# Patient Record
Sex: Male | Born: 1960 | Race: White | Hispanic: No | Marital: Single | Smoking: Never smoker
Health system: Southern US, Community
[De-identification: ages and names within clinical notes are randomized; demographics above are authoritative.]

---

## 2007-07-17 ENCOUNTER — Ambulatory Visit: Payer: Self-pay | Admitting: Internal Medicine

## 2007-07-17 LAB — CONVERTED CEMR LAB
Albumin: 4.1 g/dL (ref 3.5–5.2)
Basophils Absolute: 0 10*3/uL (ref 0.0–0.1)
Bilirubin, Direct: 0.1 mg/dL (ref 0.0–0.3)
Cholesterol: 101 mg/dL (ref 0–200)
Eosinophils Absolute: 0.3 10*3/uL (ref 0.0–0.6)
GFR calc Af Amer: 94 mL/min
GFR calc non Af Amer: 78 mL/min
Glucose, Bld: 125 mg/dL — ABNORMAL HIGH (ref 70–99)
HCT: 45.6 % (ref 39.0–52.0)
Hemoglobin, Urine: NEGATIVE
Hemoglobin: 15.4 g/dL (ref 13.0–17.0)
Hgb A1c MFr Bld: 5.9 % (ref 4.6–6.0)
Ketones, ur: NEGATIVE mg/dL
Lymphocytes Relative: 38 % (ref 12.0–46.0)
MCHC: 33.9 g/dL (ref 30.0–36.0)
MCV: 84.4 fL (ref 78.0–100.0)
Monocytes Absolute: 0.8 10*3/uL — ABNORMAL HIGH (ref 0.2–0.7)
Neutro Abs: 3.2 10*3/uL (ref 1.4–7.7)
Neutrophils Relative %: 44.8 % (ref 43.0–77.0)
PSA: 0.44 ng/mL (ref 0.10–4.00)
Potassium: 3.9 meq/L (ref 3.5–5.1)
RBC: 5.4 M/uL (ref 4.22–5.81)
Sodium: 143 meq/L (ref 135–145)
TSH: 1.61 microintl units/mL (ref 0.35–5.50)
Total CHOL/HDL Ratio: 5.1
Urobilinogen, UA: 0.2 (ref 0.0–1.0)

## 2007-07-21 ENCOUNTER — Ambulatory Visit: Payer: Self-pay | Admitting: Internal Medicine

## 2007-09-27 ENCOUNTER — Encounter: Payer: Self-pay | Admitting: Internal Medicine

## 2016-06-29 ENCOUNTER — Ambulatory Visit
Admission: RE | Admit: 2016-06-29 | Discharge: 2016-06-29 | Disposition: A | Discharge status: Home / Self Care | Payer: BLUE CROSS/BLUE SHIELD | Source: Ambulatory Visit | Attending: Gastroenterology | Admitting: Gastroenterology

## 2016-06-29 ENCOUNTER — Other Ambulatory Visit: Payer: Self-pay | Admitting: Gastroenterology

## 2016-06-29 DIAGNOSIS — R079 Chest pain, unspecified: Secondary | ICD-10-CM

## 2016-07-12 ENCOUNTER — Other Ambulatory Visit: Payer: Self-pay | Admitting: Gastroenterology

## 2016-07-12 DIAGNOSIS — R1011 Right upper quadrant pain: Secondary | ICD-10-CM

## 2016-07-16 ENCOUNTER — Ambulatory Visit
Admission: RE | Admit: 2016-07-16 | Discharge: 2016-07-16 | Disposition: A | Discharge status: Home / Self Care | Payer: BLUE CROSS/BLUE SHIELD | Source: Ambulatory Visit | Attending: Gastroenterology | Admitting: Gastroenterology

## 2016-07-16 DIAGNOSIS — R1011 Right upper quadrant pain: Secondary | ICD-10-CM

## 2016-08-03 ENCOUNTER — Institutional Professional Consult (permissible substitution): Payer: BLUE CROSS/BLUE SHIELD | Admitting: Pulmonary Disease

## 2017-06-22 IMAGING — CR DG CHEST 2V
2 series · 2 of 2 positions shown · non-contrast
Comparison: None.

CLINICAL DATA: Mid anterior chest pain for 1 month.

EXAM:
CHEST  2 VIEW

[w chest pa]
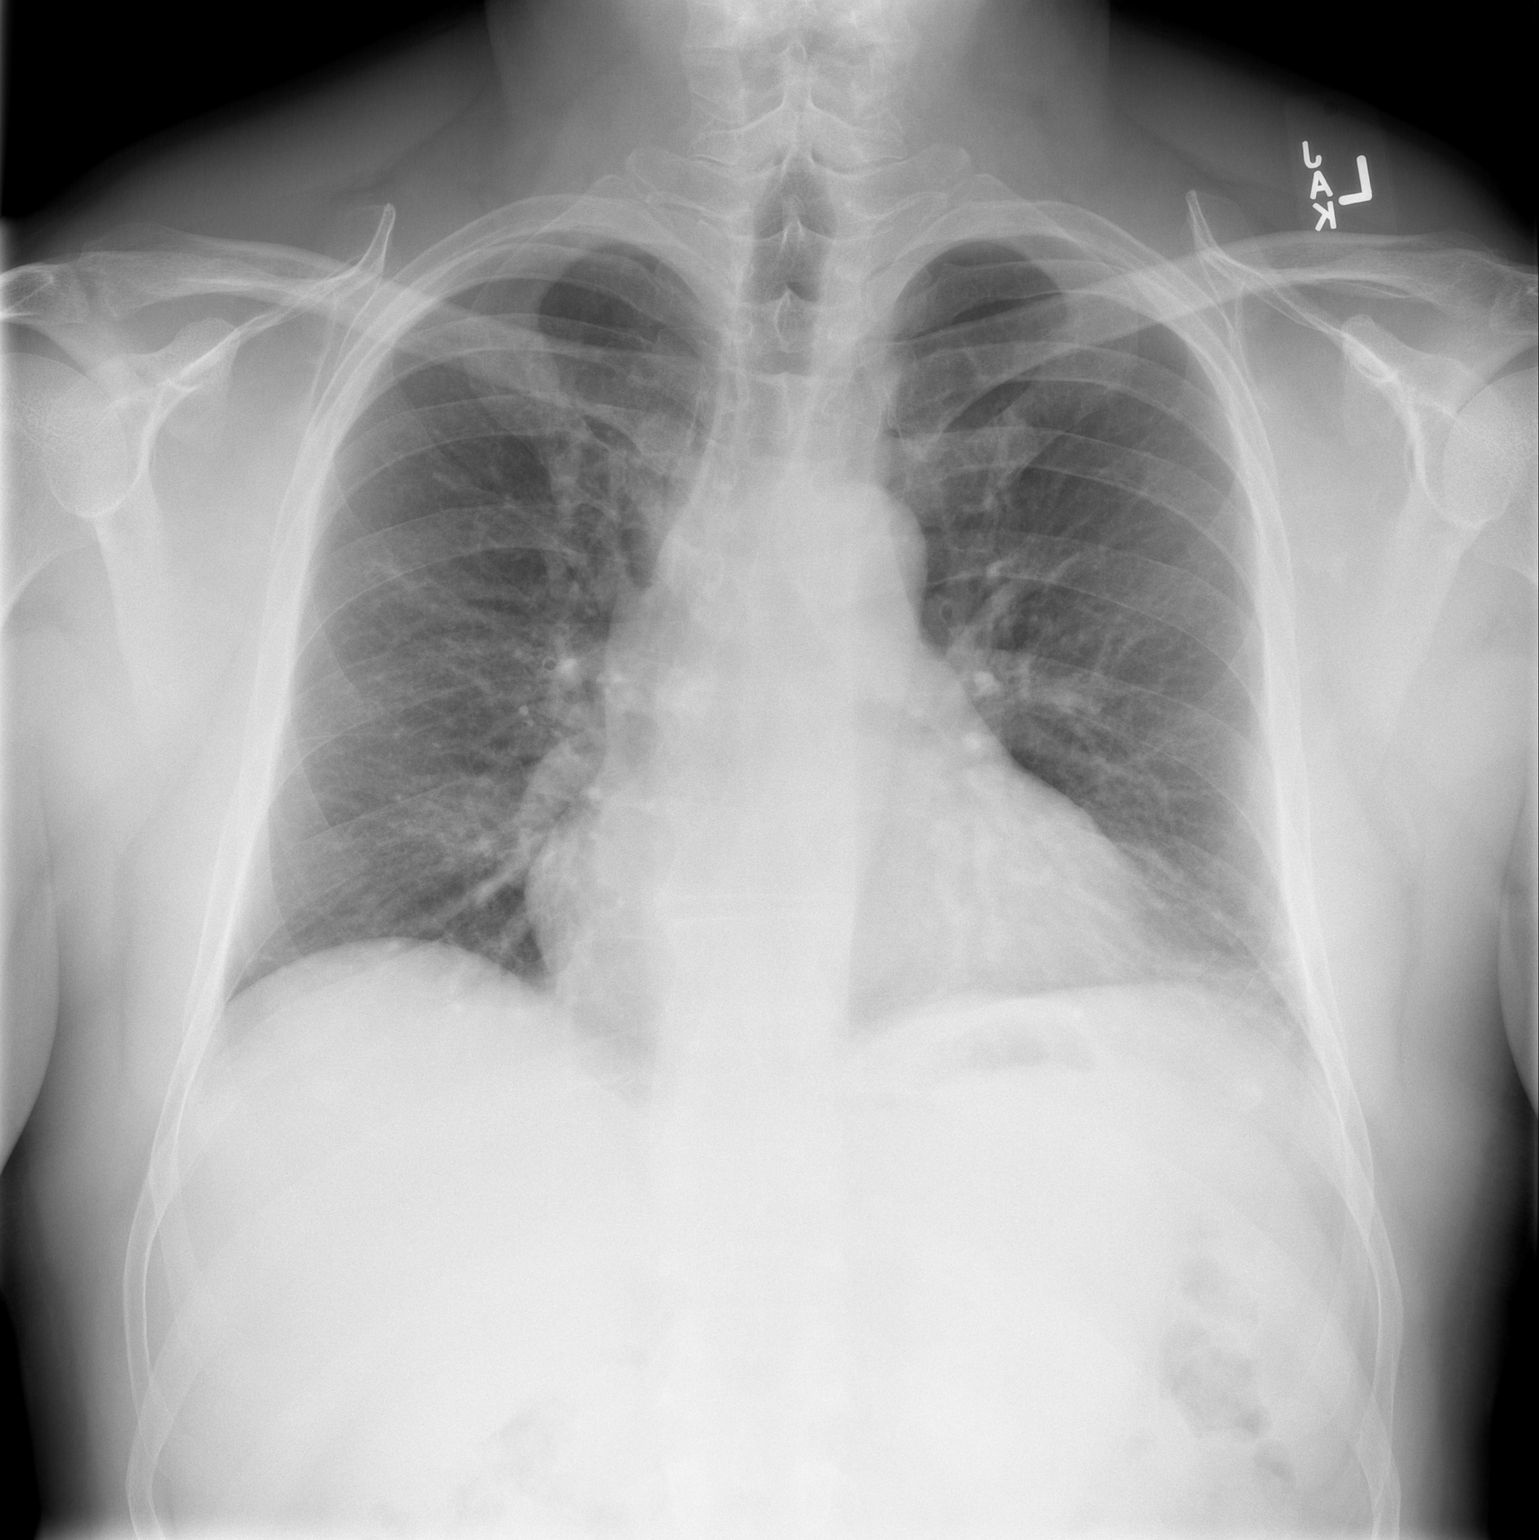

[w chest lat]
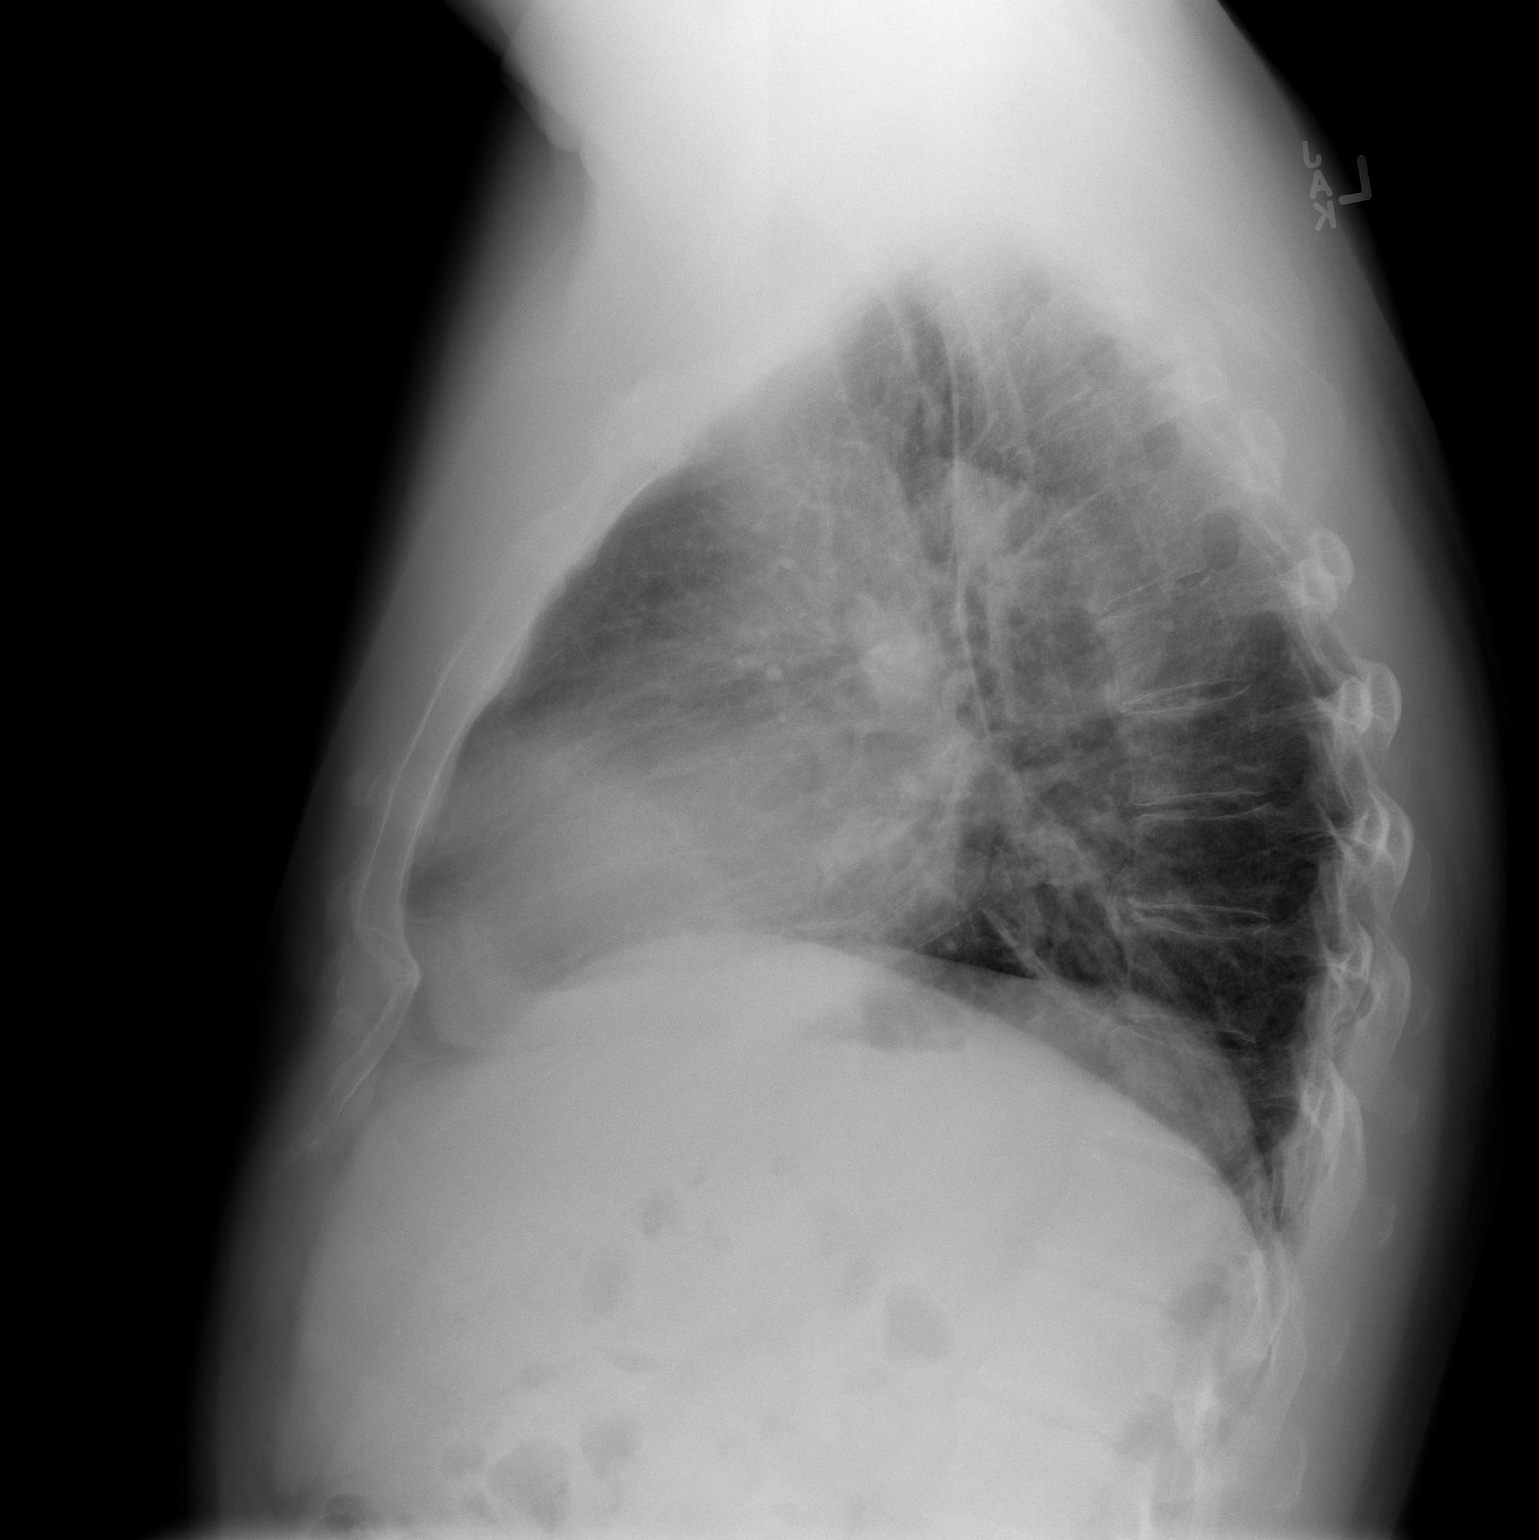

[2 of 2 positions shown; findings below may reference images not displayed]

FINDINGS: The mediastinal contour is normal. Heart size is mildly enlarged.
There is no focal infiltrate, pulmonary edema, or pleural effusion.
There is mild patchy atelectasis or scar of the lateral left lung
base. The visualized skeletal structures are unremarkable.
IMPRESSION: No active cardiopulmonary disease. Mild atelectasis versus scar of
lateral left lung base.

## 2017-07-09 IMAGING — US US ABDOMEN LIMITED
1 series · 14 of 25 positions shown · non-contrast
Comparison: None.

CLINICAL DATA: Right upper quadrant abdominal pain for 2 months.

EXAM:
US ABDOMEN LIMITED - RIGHT UPPER QUADRANT

[Series 1: us abdomen limited · 0.27mm/px · 14 of 33 slices shown]
[im 1/33]
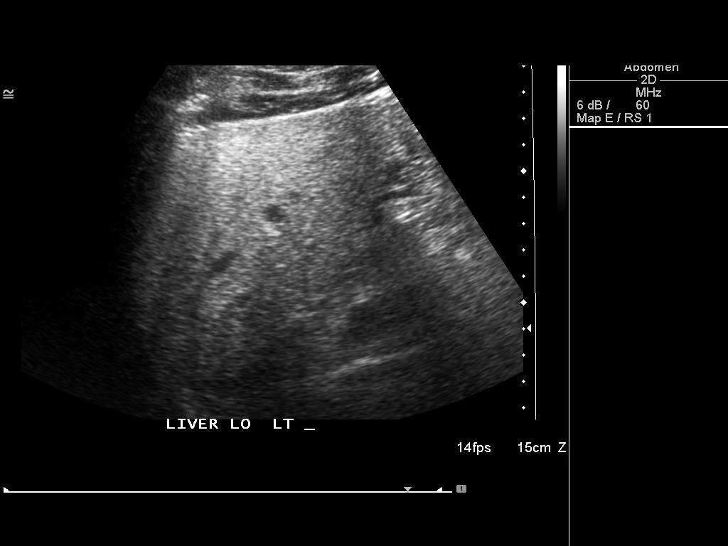
[im 3/33]
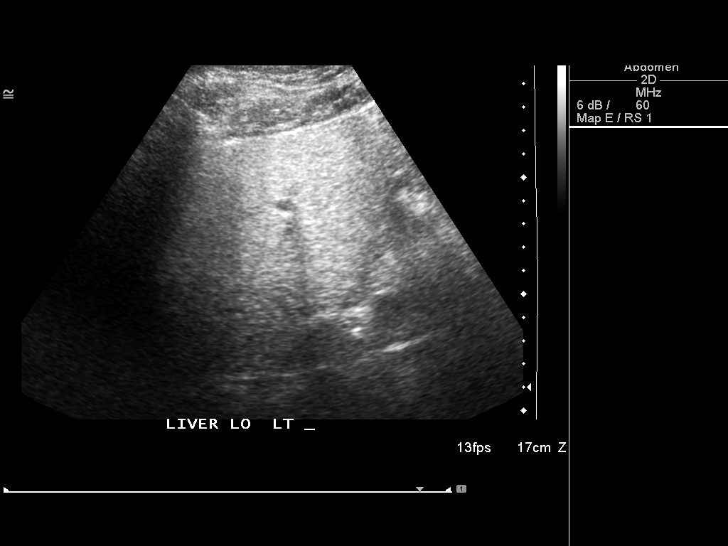
[im 6/33]
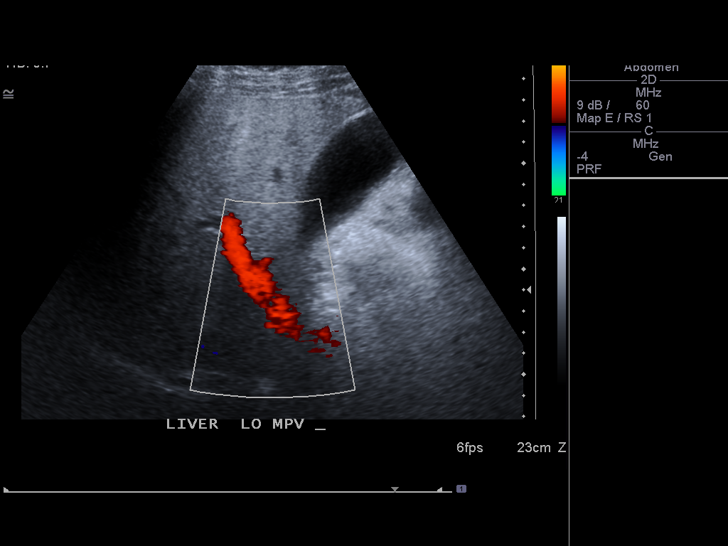
[im 9/33]
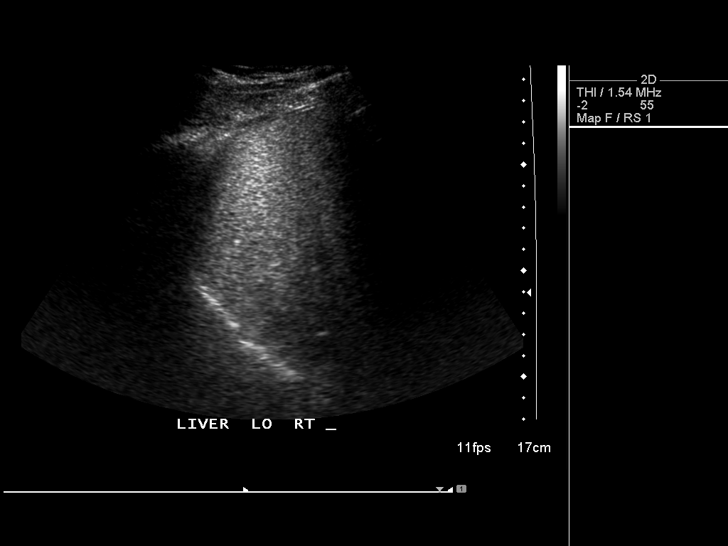
[im 11/33]
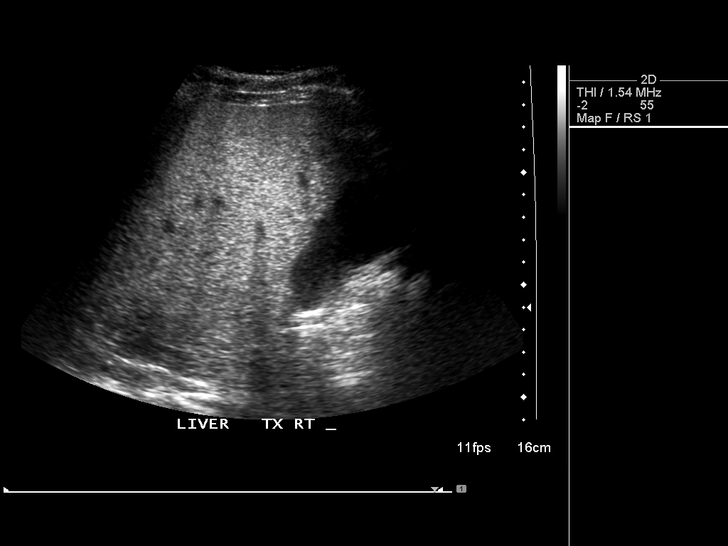
[im 13/33]
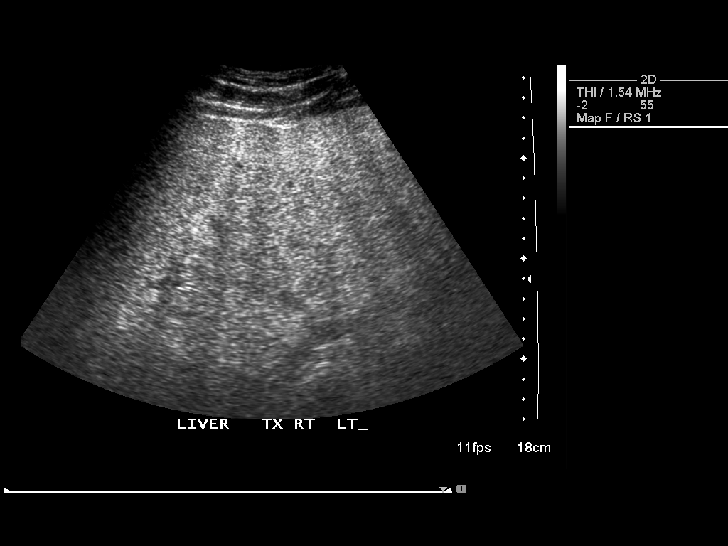
[im 15/33]
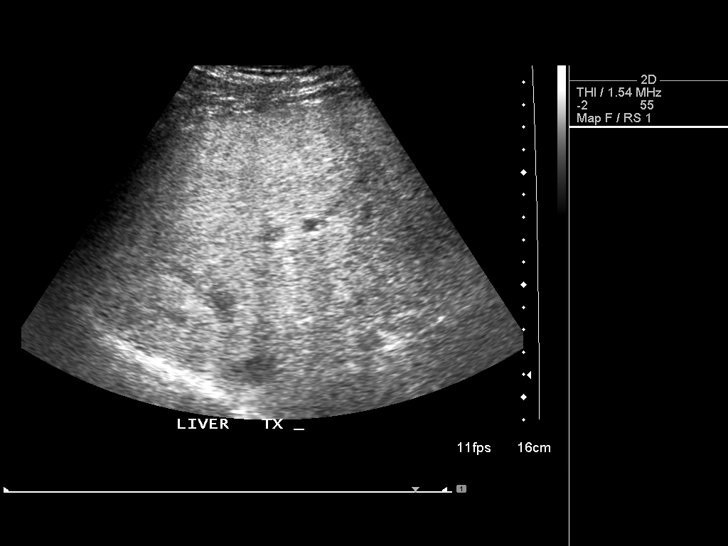
[im 18/33]
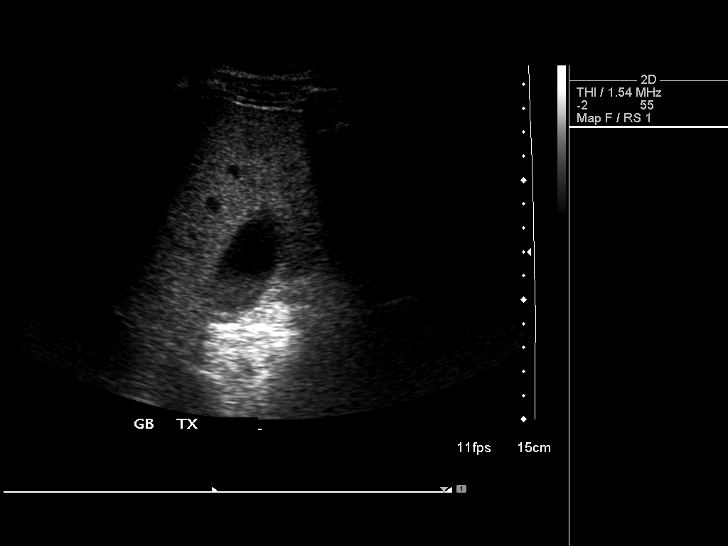
[im 21/33]
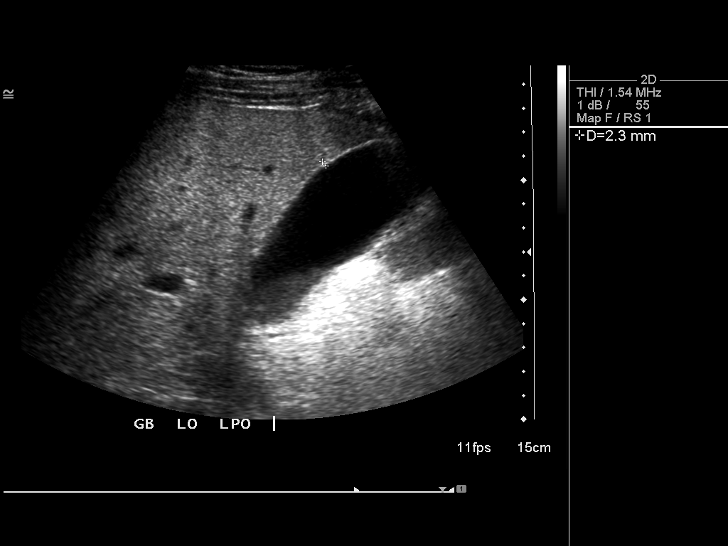
[im 22/33]
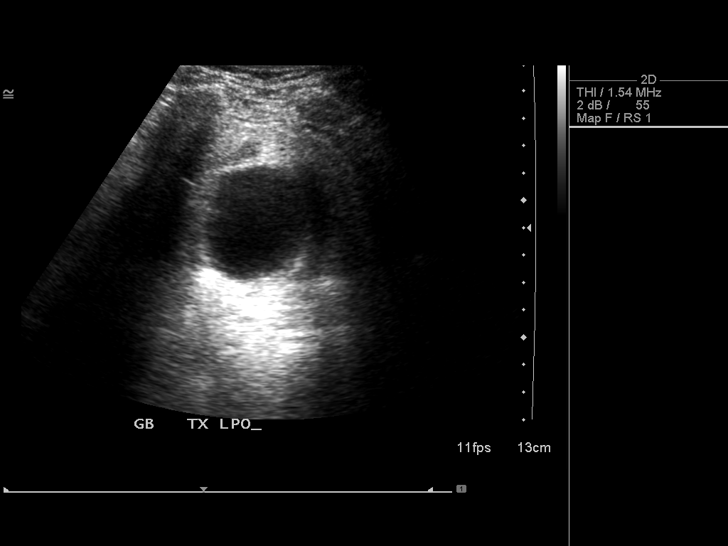
[im 25/33]
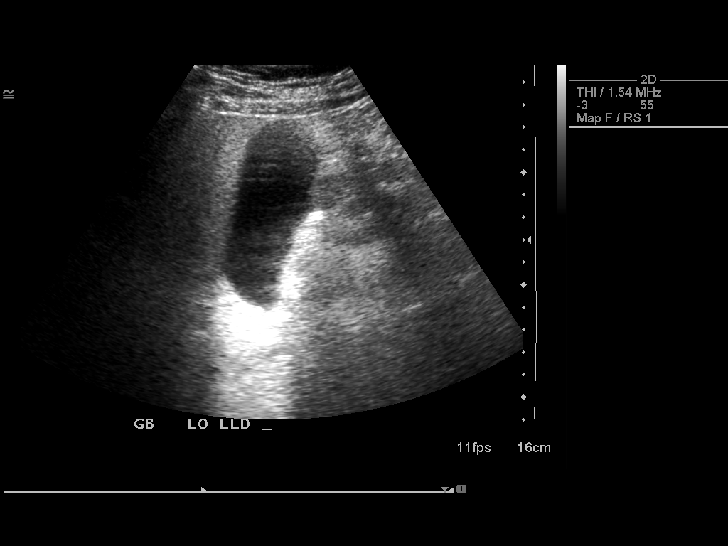
[im 27/33]
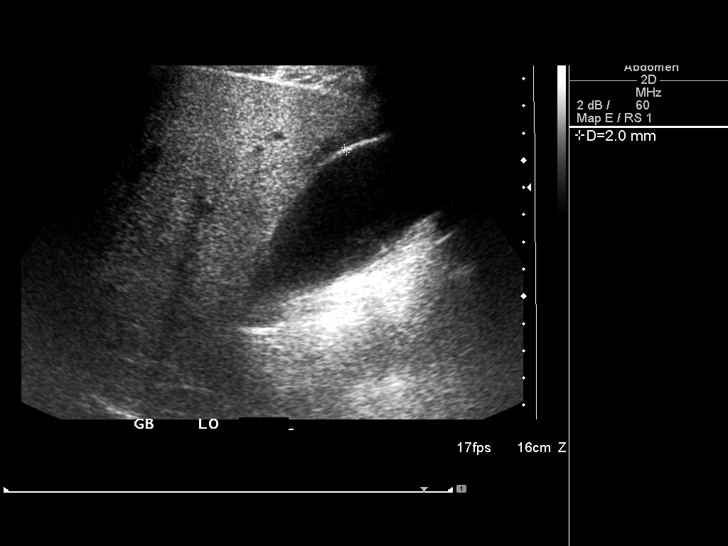
[im 30/33]
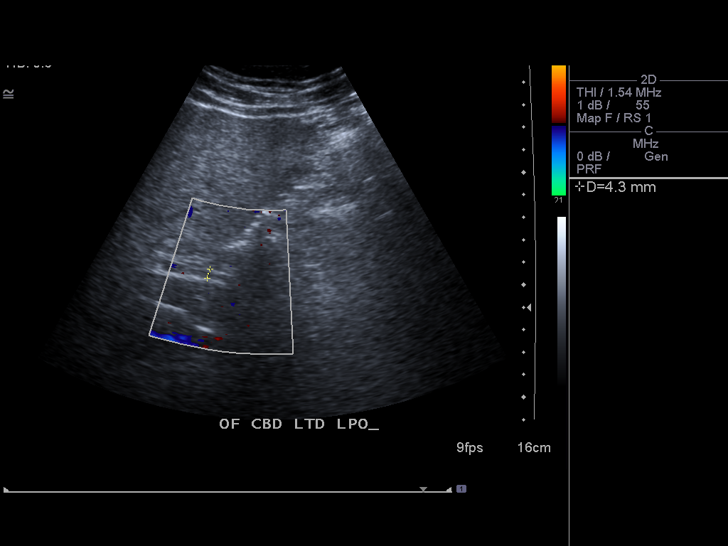
[im 33/33]
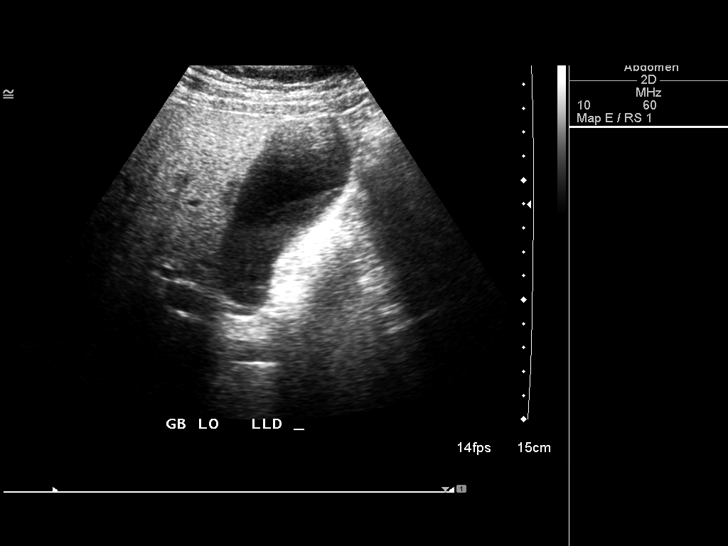

[14 of 25 positions shown; findings below may reference images not displayed]

FINDINGS: Gallbladder:

No gallstones or wall thickening visualized. No sonographic Murphy
sign noted by sonographer. Sludge is noted in the gallbladder lumen.

Common bile duct:

Diameter: 4.3 mm which is within normal limits.

Liver:

No focal lesion identified. Increased echogenicity is noted
consistent with fatty infiltration cannot.
IMPRESSION: Fatty infiltration of the liver. Sludge within gallbladder lumen. No
other abnormality seen in the right upper quadrant of the abdomen.

## 2021-08-11 ENCOUNTER — Other Ambulatory Visit: Payer: Self-pay

## 2021-08-11 ENCOUNTER — Emergency Department (HOSPITAL_BASED_OUTPATIENT_CLINIC_OR_DEPARTMENT_OTHER)
Admission: EM | Admit: 2021-08-11 | Discharge: 2021-08-11 | Disposition: A | Discharge status: Home / Self Care | Payer: BC Managed Care – PPO | Attending: Emergency Medicine | Admitting: Emergency Medicine

## 2021-08-11 ENCOUNTER — Encounter (HOSPITAL_BASED_OUTPATIENT_CLINIC_OR_DEPARTMENT_OTHER): Payer: Self-pay | Admitting: Emergency Medicine

## 2021-08-11 DIAGNOSIS — W228XXA Striking against or struck by other objects, initial encounter: Secondary | ICD-10-CM | POA: Insufficient documentation

## 2021-08-11 DIAGNOSIS — Y9389 Activity, other specified: Secondary | ICD-10-CM | POA: Diagnosis not present

## 2021-08-11 DIAGNOSIS — M779 Enthesopathy, unspecified: Secondary | ICD-10-CM | POA: Diagnosis not present

## 2021-08-11 DIAGNOSIS — M6790 Unspecified disorder of synovium and tendon, unspecified site: Secondary | ICD-10-CM

## 2021-08-11 DIAGNOSIS — M25821 Other specified joint disorders, right elbow: Secondary | ICD-10-CM | POA: Diagnosis not present

## 2021-08-11 DIAGNOSIS — M25521 Pain in right elbow: Secondary | ICD-10-CM | POA: Diagnosis present

## 2021-08-11 MED ORDER — IBUPROFEN 600 MG PO TABS: 600.0000 mg | ORAL_TABLET | Freq: Four times a day (QID) | ORAL | 0 refills | Status: AC

## 2021-08-11 MED ORDER — MELOXICAM 15 MG PO TABS: 15.0000 mg | ORAL_TABLET | Freq: Every day | ORAL | 0 refills | Status: AC

## 2021-08-11 MED ORDER — CYCLOBENZAPRINE HCL 10 MG PO TABS: 10.0000 mg | ORAL_TABLET | Freq: Two times a day (BID) | ORAL | 0 refills | Status: AC | PRN

## 2021-08-11 MED ORDER — IBUPROFEN 600 MG PO TABS: 600.0000 mg | ORAL_TABLET | Freq: Four times a day (QID) | ORAL | 0 refills | Status: DC | PRN

## 2021-08-11 MED ORDER — IBUPROFEN 400 MG PO TABS: 600.0000 mg | ORAL_TABLET | Freq: Once | ORAL | Status: DC

## 2021-08-11 MED ORDER — MELOXICAM 15 MG PO TABS: 15.0000 mg | ORAL_TABLET | Freq: Every day | ORAL | 0 refills | Status: DC

## 2021-08-11 NOTE — Discharge Instructions (Addendum)
Take the medication prescribed for the next 5 days with food.  Call the orthopedist to ensure optimal healing and future prevention of the condition.  We recommend no heavy lifting more than 10 pounds, any activity that requires repeated bending and extending the elbow for the 1 week or until cleared by orthopedic doctor.  Mobic is for wait and watch approach.  Take it only if the anti-inflammatory medication prescribed has not helped and do not combine Mobic with any other anti-inflammatory medications.

## 2021-08-11 NOTE — ED Provider Notes (Signed)
[facility] EMERGENCY DEPT Provider Note   CSN: [id number] Arrival date & time: 08/11/21  6834     History Chief Complaint  Patient presents with   Arm Injury    Edwin Thomas is a 60 y.o. male.  HPI    60 year old male comes in with chief complaint of arm pain and numbness.  Patient reports that he was golfing, he must of hit a root while swinging and is having pain over his right forearm.  Pain with flexion over the elbow and also he is experiencing some difficulty with gripping with his right hand and tingling sensation in his distal pads for all digits.  No history of similar injury or symptoms in the past.  He has some discomfort with palpation over the antecubital fossa.   History reviewed. No pertinent past medical history.  There are no problems to display for this patient.   History reviewed. No pertinent surgical history.     No family history on file.  Social History   Tobacco Use   Smoking status: Never   Smokeless tobacco: Never  Substance Use Topics   Alcohol use: Yes    Home Medications Prior to Admission medications   Medication Sig Start Date End Date Taking? Authorizing Provider  cyclobenzaprine (FLEXERIL) 10 MG tablet Take 1 tablet (10 mg total) by mouth 2 (two) times daily as needed for muscle spasms. 08/11/21  Yes Derwood Kaplan, MD  ibuprofen (ADVIL) 600 MG tablet Take 1 tablet (600 mg total) by mouth 4 (four) times daily. 08/11/21   Derwood Kaplan, MD  meloxicam (MOBIC) 15 MG tablet Take 1 tablet (15 mg total) by mouth daily. Cannot be combined with other antiinflammatory. Take with food or antacids 08/11/21   Derwood Kaplan, MD    Allergies    Patient has no known allergies.  Review of Systems   Review of Systems  Constitutional:  Positive for activity change.  Musculoskeletal:  Positive for arthralgias.  Skin:  Negative for wound.  Neurological:  Negative for numbness.  Hematological:  Does not bruise/bleed easily.    Physical Exam Updated Vital Signs BP (!) 144/83   Pulse 61   Temp 98.1 F (36.7 C) (Oral)   Resp 16   Ht 6\' 3"  (1.905 m)   Wt 102.1 kg   SpO2 97%   BMI 28.12 kg/m   Physical Exam Vitals and nursing note reviewed.  Constitutional:      Appearance: He is well-developed.  HENT:     Head: Atraumatic.  Cardiovascular:     Rate and Rhythm: Normal rate.  Pulmonary:     Effort: Pulmonary effort is normal.  Musculoskeletal:        General: Tenderness present. No swelling or deformity.     Cervical back: Neck supple.     Comments: Tenderness with palpation of the antecubital fossa of the right extremity. Positive tenderness with the pronation, but no tenderness with the wrist extension or flexion with extended right upper extremity.  Gross sensory exam is reassuring, patient able to discriminate between sharp and dull.  No objective numbness  Skin:    General: Skin is warm.  Neurological:     Mental Status: He is alert and oriented to person, place, and time.    ED Results / Procedures / Treatments   Labs (all labs ordered are listed, but only abnormal results are displayed) Labs Reviewed - No data to display  EKG None  Radiology No results found.  Procedures Procedures  Medications Ordered in ED Medications  ibuprofen (ADVIL) tablet 600 mg (has no administration in time range)    ED Course  I have reviewed the triage vital signs and the nursing notes.  Pertinent labs & imaging results that were available during my care of the patient were reviewed by me and considered in my medical decision making (see chart for details).    MDM Rules/Calculators/A&P                           59 year old male comes in a chief complaint of elbow pain and numbness and tingling over his fingertips.  Symptoms started when he was golfing and he thinks he might of hit a root while swinging.  Pain exaggerated with the bicipital flexion and pronation.  No tenderness over the  medial epicondyle.  Does not appear to be clear evidence of golfers elbow.  I suspect he might have tendinitis of the brachialis muscle or bicep muscle.  There could be some swelling around the neurovascular sheath over the elbow region.  Treatment is in to be conservative with the RICE and orthopedic follow-up for further diagnostic work-up if symptoms do not improve and for prevention of future injuries.  Final Clinical Impression(s) / ED Diagnoses Final diagnoses:  Olecranon impingement syndrome of right elbow  Tendinopathy    Rx / DC Orders ED Discharge Orders          Ordered    ibuprofen (ADVIL) 600 MG tablet  Every 6 hours PRN,   Status:  Discontinued        08/11/21 0832    ibuprofen (ADVIL) 600 MG tablet  4 times daily        08/11/21 0832    cyclobenzaprine (FLEXERIL) 10 MG tablet  2 times daily PRN        08/11/21 0833    meloxicam (MOBIC) 15 MG tablet  Daily,   Status:  Discontinued        08/11/21 0837    meloxicam (MOBIC) 15 MG tablet  Daily        08/11/21 0842             Derwood Kaplan, MD 08/11/21 [phone]

## 2021-08-11 NOTE — ED Triage Notes (Signed)
He was playing golf yesterday when he swung and hit a root. He states he immediately had numbness to the R arm and tingling in the fingers. States the forearm is still sore.

## 2024-11-16 ENCOUNTER — Encounter (HOSPITAL_BASED_OUTPATIENT_CLINIC_OR_DEPARTMENT_OTHER): Payer: Self-pay

## 2024-11-16 ENCOUNTER — Other Ambulatory Visit: Payer: Self-pay

## 2024-11-16 ENCOUNTER — Emergency Department (HOSPITAL_BASED_OUTPATIENT_CLINIC_OR_DEPARTMENT_OTHER): Admitting: Radiology

## 2024-11-16 ENCOUNTER — Other Ambulatory Visit (HOSPITAL_BASED_OUTPATIENT_CLINIC_OR_DEPARTMENT_OTHER): Payer: Self-pay

## 2024-11-16 ENCOUNTER — Emergency Department (HOSPITAL_BASED_OUTPATIENT_CLINIC_OR_DEPARTMENT_OTHER)
Admission: EM | Admit: 2024-11-16 | Discharge: 2024-11-16 | Disposition: A | Discharge status: Home / Self Care | Attending: Emergency Medicine | Admitting: Emergency Medicine

## 2024-11-16 DIAGNOSIS — R051 Acute cough: Secondary | ICD-10-CM | POA: Diagnosis present

## 2024-11-16 DIAGNOSIS — U071 COVID-19: Secondary | ICD-10-CM | POA: Insufficient documentation

## 2024-11-16 DIAGNOSIS — I10 Essential (primary) hypertension: Secondary | ICD-10-CM | POA: Insufficient documentation

## 2024-11-16 LAB — RESP PANEL BY RT-PCR (RSV, FLU A&B, COVID)  RVPGX2
Influenza A by PCR: NEGATIVE
Influenza B by PCR: NEGATIVE
Resp Syncytial Virus by PCR: NEGATIVE
SARS Coronavirus 2 by RT PCR: POSITIVE — AB

## 2024-11-16 MED ORDER — PAXLOVID (300/100) 20 X 150 MG & 10 X 100MG PO TBPK: 3.0000 | ORAL_TABLET | Freq: Two times a day (BID) | ORAL | 0 refills | Status: AC

## 2024-11-16 MED ORDER — BENZONATATE 100 MG PO CAPS: 100.0000 mg | ORAL_CAPSULE | Freq: Three times a day (TID) | ORAL | 0 refills | Status: AC | PRN

## 2024-11-16 NOTE — ED Triage Notes (Signed)
 Pt reports productive cough x3 days and a fever x2 days ago.

## 2024-11-16 NOTE — ED Provider Notes (Signed)
 " [facility] EMERGENCY DEPARTMENT AT [facility] Provider Note   CSN: [id number] Arrival date & time: 11/16/24  9166     Patient presents with: Cough   Edwin Thomas is a 63 y.o. male who presents to the emergency department with a chief complaint of cough as well as fever.  Patient states that he has had a cough for the last 3 days.  States that 2 days ago he also had a fever for approximately a day and a half.  He states however that the fever has now resolved and the cough is lingering.  Unsure of sick contacts.  Denies congestion, ear pain, or throat pain.  Denies chest pain or shortness of breath.  Past medical history significant for hypertension, hyperlipidemia, seasonal allergies, as well as sleep disturbances.  Patient recently had a physical on 11/05/2024 with his primary care provider and had screening labs done at this time.    Cough      Prior to Admission medications  Medication Sig Start Date End Date Taking? Authorizing Provider  amLODipine (NORVASC) 10 MG tablet Take 10 mg by mouth daily. 06/04/22  Yes [provider]  amLODipine-olmesartan (AZOR) 5-20 MG tablet Take 1 tablet by mouth daily. 11/06/24  Yes [provider]  amoxicillin-clavulanate (AUGMENTIN) 875-125 MG tablet Take 1 tablet by mouth every 12 (twelve) hours. 10/30/24  Yes [provider]  benzonatate  (TESSALON ) 100 MG capsule Take 1 capsule (100 mg total) by mouth 3 (three) times daily as needed for cough. 11/16/24  Yes Tristyn Pharris F, PA-C  cetirizine (ZYRTEC) 10 MG tablet Take 10 mg by mouth daily. 03/06/24  Yes [provider]  nirmatrelvir/ritonavir (PAXLOVID , 300/100,) 20 x 150 MG & 10 x 100MG  TBPK Take 3 tablets by mouth 2 (two) times daily for 5 days. Patient GFR is 76. Take nirmatrelvir (150 mg) two tablets twice daily for 5 days and ritonavir (100 mg) one tablet twice daily for 5 days. 11/16/24 11/21/24 Yes Ziggy Reveles F, PA-C  cyclobenzaprine   (FLEXERIL ) 10 MG tablet Take 1 tablet (10 mg total) by mouth 2 (two) times daily as needed for muscle spasms. 08/11/21   Charlyn Sora, MD  ibuprofen  (ADVIL ) 600 MG tablet Take 1 tablet (600 mg total) by mouth 4 (four) times daily. 08/11/21   Charlyn Sora, MD  meloxicam  (MOBIC ) 15 MG tablet Take 1 tablet (15 mg total) by mouth daily. Cannot be combined with other antiinflammatory. Take with food or antacids 08/11/21   Charlyn Sora, MD  simvastatin (ZOCOR) 10 MG tablet Take 10 mg by mouth at bedtime.    [provider]  zolpidem (AMBIEN) 10 MG tablet Take 5-10 mg by mouth at bedtime as needed.    [provider]    Allergies: Patient has no known allergies.    Review of Systems  Respiratory:  Positive for cough.     Updated Vital Signs BP 134/76   Pulse 81   Temp 98.1 F (36.7 C) (Oral)   Resp 16   Ht 6' 2 (1.88 m)   Wt 106.6 kg   SpO2 98%   BMI 30.17 kg/m   Physical Exam Vitals and nursing note reviewed.  Constitutional:      General: He is awake. He is not in acute distress.    Appearance: Normal appearance. He is not ill-appearing, toxic-appearing or diaphoretic.  HENT:     Head: Normocephalic and atraumatic.  Eyes:     General: No scleral icterus. Cardiovascular:  Rate and Rhythm: Normal rate and regular rhythm.  Pulmonary:     Effort: Pulmonary effort is normal. No respiratory distress.     Breath sounds: No stridor. No wheezing, rhonchi or rales.  Musculoskeletal:        General: Normal range of motion.     Cervical back: Normal range of motion.     Right lower leg: No edema.     Left lower leg: No edema.     Comments: Grossly normal range of motion of all 4 extremities, patient ambulatory without assistance  Skin:    General: Skin is warm.     Capillary Refill: Capillary refill takes less than 2 seconds.  Neurological:     General: No focal deficit present.     Mental Status: He is alert and oriented to person, place, and time.   Psychiatric:        Mood and Affect: Mood normal.        Behavior: Behavior normal. Behavior is cooperative.     (all labs ordered are listed, but only abnormal results are displayed) Labs Reviewed  RESP PANEL BY RT-PCR (RSV, FLU A&B, COVID)  [id number] - Abnormal; Notable for the following components:      Result Value   SARS Coronavirus 2 by RT PCR POSITIVE (*)    All other components within normal limits    EKG: None  Radiology: DG Chest 2 View Result Date: 11/16/2024 CLINICAL DATA:  Productive cough and fever for 3 days EXAM: CHEST - 2 VIEW COMPARISON:  June 29, 2016 FINDINGS: The heart size and mediastinal contours are within normal limits. Both lungs are clear. The visualized skeletal structures are unremarkable. IMPRESSION: No active cardiopulmonary disease. Electronically Signed   By: Lynwood Landy Raddle M.D.   On: 11/16/2024 09:01     Procedures   Medications Ordered in the ED - No data to display                                  Medical Decision Making Amount and/or Complexity of Data Reviewed Radiology: ordered.  Risk Prescription drug management.   Patient presents to the ED for concern of cough, fever, this involves an extensive number of treatment options, and is a complaint that carries with it a high risk of complications and morbidity.  The differential diagnosis includes viral syndrome including COVID, flu, RSV, sinusitis, other viral syndrome, pneumonia, etc.   Co morbidities that complicate the patient evaluation  hypertension, hyperlipidemia, seasonal allergies, as well as sleep disturbances   Additional history obtained:  Reviewed most recent PCP visit from 11/05/24, at this time patient had screening labs completed, GFR 76   Lab Tests:  I Ordered, and personally interpreted labs.  The pertinent results include:  Respiratory panel positive for COVID   Imaging Studies ordered:  I ordered imaging studies including CXR I independently  visualized and interpreted imaging which showed no active cardiopulmonary disease I agree with the radiologist interpretation   Medicines ordered and prescription drug management:  I ordered medication including Paxlovid   for COVID 19, tessalon  perles for cough  Reevaluation of the patient after these medicines showed that the patient stayed the same I have reviewed the patients home medicines and have made adjustments as needed   Test Considered:  Labs: Deferred at this time as patient had recent screening labs done on 11/05/2024 at PCP, reviewed this for patient's kidney function relating to  Paxlovid  prescription, I do not believe further lab work would change management at this time   Critical Interventions:  None   Consultations Obtained:  I requested consultation with the pharmacy team,  and discussed lab and imaging findings as well as pertinent plan - they recommend: Stopping statin medication, taking Paxlovid  dose at least 12 hours after last statin dose, Paxlovid  course of 5 days, resume statin at least 5 days after completed Paxlovid  course   Problem List / ED Course:  63 year old male, vital signs stable, chief complaint of cough as well as fever, fever has resolved over the last day and a half, no known sick contacts, no chest pain, shortness of breath, congestion, or sore throat, no ear pain On physical exam patient is overall well-appearing and ambulatory around the room, patient has limited comorbidities, no obvious abnormality with auscultation of heart or lungs, no evidence of hypoxia or tachycardia, patient is talking in full sentences on room air Educated patient on workup and findings today, no evidence of pneumonia on chest x-ray, patient is COVID-positive, shared decision making discussion with patient, patient would like to trial Paxlovid  medication Consulted pharmacy who recommends stopping statin medication and proceeding with Paxlovid  and then resuming statin  medication at least 5 days after completed course, educated patient on this, also told him to inform his primary care provider so they can follow-up about this Considered PE however patient is not tachycardic or hypoxic, no chest pain or shortness of breath only lingering cough, patient also tested positive for COVID, no calf pain, redness, swelling, or known PE or DVT risk factors, patient has had infectious symptoms recently Return precautions given Patient discharged Most likely diagnosis at this time is uncomplicated COVID-19 infection, symptomatic treatment given, doubt pneumonia given physical exam as well as chest x-ray, patient extremely well-appearing   Reevaluation:  After the interventions noted above, I reevaluated the patient and found that they have : Stayed the same   Social Determinants of Health:  none   Dispostion:  After consideration of the diagnostic results and the patients response to treatment, I feel that the patient would benefit from discharge and outpatient therapy as described.     Final diagnoses:  COVID-19  Acute cough    ED Discharge Orders          Ordered    benzonatate  (TESSALON ) 100 MG capsule  3 times daily PRN        11/16/24 1103    nirmatrelvir/ritonavir (PAXLOVID , 300/100,) 20 x 150 MG & 10 x 100MG  TBPK  2 times daily        11/16/24 [address] PA-C 11/17/24 1208    Tegeler, Edwin PARAS, MD 11/17/24 1606  "

## 2024-11-16 NOTE — Discharge Instructions (Addendum)
 It was a pleasure taking care of you today.  Based on your history and physical exam as well as your labs and imaging I feel you are safe for discharge.  Today you tested positive for COVID-19.  Your chest x-ray however was clear with no evidence of pneumonia.  Because of this I have sent in a few prescriptions to the pharmacy. One is tessalon  perles which you may take as needed for cough. For other allergy symptoms please continue your daily zyrtec and you may also use over the counter flonase. Recommend you continue to use tylenol and/or ibuprofen  for fever as well as pain. Please do not exceed more than 1000 mg of Tylenol in a single dose or more than 4000 mg in a single day.  Please do not take more than 6 to 800 mg of ibuprofen  in a single dose.  I have also sent in Paxlovid  to your pharmacy.  Please pick it up and take as instructed.  Please complete the entire 5-day course.  Please do not take any more doses of your statin medication and take your first dose of Paxlovid  tonight (at least 12 hours after your last statin dose) and do not take your statin throughout the course of Paxlovid  and do not restart it for at least 5 days following the completed course.  Please make your primary care provider aware of your visit and all findings today, please also make them aware that I have started you on Paxlovid  and that you are holding your statin medication.  If you experience any of the following symptoms including but not limited to refractory fever, weakness, chest pain, shortness of breath, weakness, or other concerning symptom please return to the emergency department or seek further medical care.  COVID is a virus and is contagious, typical symptoms last anywhere from 7 to 14 days.  Recommend limiting contacts and please perform good hand hygiene.
